# Patient Record
Sex: Female | Born: 1965 | Hispanic: Yes | Marital: Married
Health system: Southern US, Community
[De-identification: ages and names within clinical notes are randomized; demographics above are authoritative.]

## PROBLEM LIST (undated history)

## (undated) DIAGNOSIS — I10 Essential (primary) hypertension: Secondary | ICD-10-CM

## (undated) HISTORY — PX: ABDOMINAL HYSTERECTOMY: SHX81

## (undated) HISTORY — DX: Essential (primary) hypertension: I10

---

## 2006-05-14 ENCOUNTER — Ambulatory Visit: Payer: Self-pay | Admitting: Family Medicine

## 2006-05-30 ENCOUNTER — Ambulatory Visit: Payer: Self-pay | Admitting: Family Medicine

## 2006-07-09 ENCOUNTER — Inpatient Hospital Stay: Payer: Self-pay | Admitting: Obstetrics and Gynecology

## 2014-06-28 ENCOUNTER — Emergency Department: Payer: Self-pay | Admitting: Emergency Medicine

## 2014-06-28 LAB — CBC WITH DIFFERENTIAL/PLATELET
BASOS PCT: 0.5 %
Basophil #: 0 10*3/uL (ref 0.0–0.1)
Eosinophil #: 0.4 10*3/uL (ref 0.0–0.7)
Eosinophil %: 4.4 %
HCT: 30.3 % — ABNORMAL LOW (ref 35.0–47.0)
HGB: 9.2 g/dL — AB (ref 12.0–16.0)
LYMPHS PCT: 14.6 %
Lymphocyte #: 1.3 10*3/uL (ref 1.0–3.6)
MCH: 19.1 pg — ABNORMAL LOW (ref 26.0–34.0)
MCHC: 30.3 g/dL — ABNORMAL LOW (ref 32.0–36.0)
MCV: 63 fL — ABNORMAL LOW (ref 80–100)
MONO ABS: 0.4 x10 3/mm (ref 0.2–0.9)
MONOS PCT: 3.9 %
NEUTROS ABS: 6.8 10*3/uL — AB (ref 1.4–6.5)
NEUTROS PCT: 76.6 %
Platelet: 564 10*3/uL — ABNORMAL HIGH (ref 150–440)
RBC: 4.82 10*6/uL (ref 3.80–5.20)
RDW: 25.2 % — AB (ref 11.5–14.5)
WBC: 8.9 10*3/uL (ref 3.6–11.0)

## 2014-06-28 LAB — URINALYSIS, COMPLETE
Bacteria: NONE SEEN
Bilirubin,UR: NEGATIVE
Glucose,UR: NEGATIVE mg/dL (ref 0–75)
Ketone: NEGATIVE
Nitrite: NEGATIVE
Ph: 5 (ref 4.5–8.0)
Protein: 100
Specific Gravity: 1.026 (ref 1.003–1.030)
Squamous Epithelial: 9
Transitional Epi: 2

## 2014-06-28 LAB — COMPREHENSIVE METABOLIC PANEL
ALBUMIN: 3.3 g/dL — AB (ref 3.4–5.0)
Alkaline Phosphatase: 92 U/L
Anion Gap: 8 (ref 7–16)
BILIRUBIN TOTAL: 0.4 mg/dL (ref 0.2–1.0)
BUN: 8 mg/dL (ref 7–18)
CALCIUM: 9.1 mg/dL (ref 8.5–10.1)
CO2: 28 mmol/L (ref 21–32)
Chloride: 104 mmol/L (ref 98–107)
Creatinine: 0.58 mg/dL — ABNORMAL LOW (ref 0.60–1.30)
EGFR (African American): >60
EGFR (Non-African Amer.): >60
Glucose: 117 mg/dL — ABNORMAL HIGH (ref 65–99)
Osmolality: 279 (ref 275–301)
Potassium: 3.7 mmol/L (ref 3.5–5.1)
SGOT(AST): 15 U/L (ref 15–37)
SGPT (ALT): 13 U/L (ref 12–78)
SODIUM: 140 mmol/L (ref 136–145)
Total Protein: 8.2 g/dL (ref 6.4–8.2)

## 2014-06-28 LAB — PROTIME-INR
INR: 1
Prothrombin Time: 13.5 secs (ref 11.5–14.7)

## 2014-06-29 ENCOUNTER — Ambulatory Visit: Payer: Self-pay | Admitting: Oncology

## 2014-06-29 LAB — CBC CANCER CENTER
BASOS ABS: 0 x10 3/mm (ref 0.0–0.1)
Basophil %: 0.4 %
Eosinophil #: 0.1 x10 3/mm (ref 0.0–0.7)
Eosinophil %: 0.9 %
HCT: 32.7 % — AB (ref 35.0–47.0)
HGB: 9.8 g/dL — AB (ref 12.0–16.0)
LYMPHS ABS: 0.9 x10 3/mm — AB (ref 1.0–3.6)
Lymphocyte %: 10.8 %
MCH: 19 pg — AB (ref 26.0–34.0)
MCHC: 30.1 g/dL — ABNORMAL LOW (ref 32.0–36.0)
MCV: 63 fL — AB (ref 80–100)
MONOS PCT: 2.7 %
Monocyte #: 0.2 x10 3/mm (ref 0.2–0.9)
Neutrophil #: 7.3 x10 3/mm — ABNORMAL HIGH (ref 1.4–6.5)
Neutrophil %: 85.2 %
Platelet: 557 x10 3/mm — ABNORMAL HIGH (ref 150–440)
RBC: 5.18 10*6/uL (ref 3.80–5.20)
RDW: 26.4 % — ABNORMAL HIGH (ref 11.5–14.5)
WBC: 8.6 x10 3/mm (ref 3.6–11.0)

## 2014-06-29 LAB — COMPREHENSIVE METABOLIC PANEL
ANION GAP: 7 (ref 7–16)
Albumin: 3.3 g/dL — ABNORMAL LOW (ref 3.4–5.0)
Alkaline Phosphatase: 100 U/L
BUN: 4 mg/dL — ABNORMAL LOW (ref 7–18)
Bilirubin,Total: 0.4 mg/dL (ref 0.2–1.0)
Calcium, Total: 9 mg/dL (ref 8.5–10.1)
Chloride: 104 mmol/L (ref 98–107)
Co2: 26 mmol/L (ref 21–32)
Creatinine: 0.61 mg/dL (ref 0.60–1.30)
EGFR (African American): >60
EGFR (Non-African Amer.): >60
Glucose: 111 mg/dL — ABNORMAL HIGH (ref 65–99)
Osmolality: 271 (ref 275–301)
POTASSIUM: 3.9 mmol/L (ref 3.5–5.1)
SGOT(AST): 15 U/L (ref 15–37)
SGPT (ALT): 15 U/L (ref 12–78)
SODIUM: 137 mmol/L (ref 136–145)
Total Protein: 8.6 g/dL — ABNORMAL HIGH (ref 6.4–8.2)

## 2014-06-29 LAB — IRON AND TIBC
IRON SATURATION: 6 %
Iron Bind.Cap.(Total): 281 ug/dL (ref 250–450)
Iron: 17 ug/dL — ABNORMAL LOW (ref 50–170)
Unbound Iron-Bind.Cap.: 264 ug/dL

## 2014-06-29 LAB — HCG, QUANTITATIVE, PREGNANCY

## 2014-06-29 LAB — FERRITIN: Ferritin (ARMC): 29 ng/mL (ref 8–388)

## 2014-06-30 LAB — CA 125: CA 125: 48.3 U/mL — ABNORMAL HIGH (ref 0.0–34.0)

## 2014-06-30 LAB — URINE CULTURE

## 2014-07-06 ENCOUNTER — Ambulatory Visit: Payer: Self-pay | Admitting: Gynecologic Oncology

## 2014-07-06 LAB — BASIC METABOLIC PANEL
Anion Gap: 8 (ref 7–16)
BUN: 8 mg/dL (ref 7–18)
CO2: 26 mmol/L (ref 21–32)
Calcium, Total: 9.1 mg/dL (ref 8.5–10.1)
Chloride: 102 mmol/L (ref 98–107)
Creatinine: 0.71 mg/dL (ref 0.60–1.30)
EGFR (African American): >60
EGFR (Non-African Amer.): >60
Glucose: 115 mg/dL — ABNORMAL HIGH (ref 65–99)
Osmolality: 271 (ref 275–301)
Potassium: 3.7 mmol/L (ref 3.5–5.1)
Sodium: 136 mmol/L (ref 136–145)

## 2014-07-06 LAB — CBC
HCT: 31.4 % — ABNORMAL LOW (ref 35.0–47.0)
HGB: 9.5 g/dL — ABNORMAL LOW (ref 12.0–16.0)
MCH: 19.7 pg — ABNORMAL LOW (ref 26.0–34.0)
MCHC: 30.3 g/dL — ABNORMAL LOW (ref 32.0–36.0)
MCV: 65 fL — ABNORMAL LOW (ref 80–100)
Platelet: 545 10*3/uL — ABNORMAL HIGH (ref 150–440)
RBC: 4.83 10*6/uL (ref 3.80–5.20)
RDW: 25.7 % — ABNORMAL HIGH (ref 11.5–14.5)
WBC: 7.6 10*3/uL (ref 3.6–11.0)

## 2014-07-07 LAB — CEA: CEA: 1.4 ng/mL (ref 0.0–4.7)

## 2014-07-12 ENCOUNTER — Inpatient Hospital Stay: Payer: Self-pay | Admitting: Obstetrics & Gynecology

## 2014-07-13 LAB — BASIC METABOLIC PANEL
Anion Gap: 7 (ref 7–16)
BUN: 3 mg/dL — AB (ref 7–18)
CALCIUM: 8.1 mg/dL — AB (ref 8.5–10.1)
CREATININE: 0.68 mg/dL (ref 0.60–1.30)
Chloride: 103 mmol/L (ref 98–107)
Co2: 27 mmol/L (ref 21–32)
EGFR (African American): >60
EGFR (Non-African Amer.): >60
Glucose: 122 mg/dL — ABNORMAL HIGH (ref 65–99)
OSMOLALITY: 272 (ref 275–301)
POTASSIUM: 4.1 mmol/L (ref 3.5–5.1)
SODIUM: 137 mmol/L (ref 136–145)

## 2014-07-13 LAB — HEMATOCRIT: HCT: 26.7 % — ABNORMAL LOW (ref 35.0–47.0)

## 2014-07-15 LAB — PATHOLOGY REPORT

## 2014-07-30 ENCOUNTER — Ambulatory Visit: Payer: Self-pay | Admitting: Oncology

## 2015-04-22 NOTE — Op Note (Signed)
PATIENT NAME:  Nicole BameREDES, Nicole Hensley MR#:  [id number] DATE OF BIRTH:  Sep 25, 1966  DATE OF PROCEDURE:  07/12/2014  PREOPERATIVE DIAGNOSIS: Pelvic abdominal mass.   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   PROCEDURES PERFORMED: 1.  Exploratory laparotomy.  2.  Total abdominal hysterectomy.  3.  Bilateral salpingectomy.  4.  Lysis of adhesions.   SURGEON:  Wendy PoetBrigitte Yuka Lallier, MD  COMPLICATIONS: None.   ANESTHESIA: General.   ESTIMATED BLOOD LOSS: 350 mL.  INDICATION FOR SURGERY: Ms. Nicole Hensley is a 49 year old patient who presented with a large mainly cystic mass arising from the pelvis and extending into the upper abdomen. Therefore, the decision was made to proceed with surgery.   FINDINGS AT TIME OF SURGERY: About a 30 cm mass arising from the uterine fundus. This was mainly cystic with significant areas of necrosis. Fluid had been leaking prior to the laparotomy. Both tubes and ovaries were unremarkable. There were signs of peritoneal irritation and adhesions, but otherwise inspection of the upper abdomen and the bowel was unremarkable.   OPERATIVE REPORT: After adequate general anesthesia had been obtained, the patient was prepped and draped in supine position. A midline incision was placed with a sharp knife and carried down through the fascia. The peritoneum was identified and entered. Adhesions were lysed mainly in blunt fashion then cytology was obtained. A trocar was placed into the tumor, however, due to the partial necrosis it was impossible to contain the fluid pouring out. About 9 liters of fluid were removed. Then lysis of adhesions was continued until the mass could be completely mobilized and the pelvis evaluated. The mass was on a 4 cm stalk to the uterus. Heaney clamps were placed through the stalk and the mass was removed and sent for frozen section analysis, which confirmed the presence of a smooth muscle tumor without increased mitosis or  features worrisome for malignancy.   A Bookwalter retractor was then placed and the bowel was packed away from the pelvis.   Attention was then directed towards the hysterectomy. The round ligament on the right side was stitch ligated and transected. The pelvic sidewall was entered. The fallopian tube was removed from the normal ovary using cautery. Then a clamp was placed around the utero-ovarian ligament which was then cut, tied and stitch ligated using 0 Vicryl. The same procedure was performed on the contralateral side. Then the anterior fold of the peritoneum was incised and the bladder was freed from lower uterine segment, cervix and upper vagina. Dissection was carefully continued until the uterine vessels were identified, clamped cut and stitch ligated using 0 Vicryl. The uterus was then freed from the remainder of the cardinal uterosacral ligament by serially placing clamps and cutting pedicles which were stitch ligated with 0 Vicryl. Finally, the vaginal apex was entered and uterus and both tubes and cervix were removed completely. The vagina was closed with figure-of-eight stitches using 0 Vicryl. Irrigation of the pelvis was performed and adequate hemostasis was confirmed. The ovaries were fixated to the stump of the round ligament.   Exploration of the upper abdomen was done and adequate hemostasis was noted in all areas. Signs of chronic adhesions were seen. A small amount of tumor was still persistent on the sigmoid mesentery. This was removed by serially clamping pedicles, which were cut and stitch ligated with 2-0 Vicryl.   Copious irrigation of the abdomen was done and adequate hemostasis confirmed in all areas. Lap sponges and retractors were removed. A small omental biopsy was  taken by dissecting pedicles, which were clamped, cut and ligated with 0 Vicryl.   The fascia was closed with a running #1 PDS loop suture. Irrigation of the subcutaneous tissue was done and adequate hemostasis  confirmed before it was reapproximated with 2-0 Vicryl, and the skin was closed with a 4-0 Vicryl intracuticular stitch. Dermabond was applied.   The patient tolerated the procedure well and was taken to the recovery room in satisfactory condition. Postoperative urine was clear. Pad, sponge, needle and instrument counts were correct x2.   ____________________________ Maxine Glenn, MD bem:sb D: 07/12/2014 13:50:29 ET T: 07/12/2014 15:41:45 ET JOB#: [id number]  cc: Maxine Glenn, MD, <Dictator> Maxine Glenn MD ELECTRONICALLY SIGNED 07/12/2014 18:06

## 2016-02-11 IMAGING — CT CT ABD-PELV W/ CM
2 of 5 series · 16 of 46 positions shown, 18 images · IV contrast (isovue)
Comparison: None.

CLINICAL DATA: Lower abdominal pain and distention.

EXAM:
CT ABDOMEN AND PELVIS WITH CONTRAST
TECHNIQUE: Multidetector CT imaging of the abdomen and pelvis was performed
using the standard protocol following bolus administration of
intravenous contrast.
CONTRAST:  100 mL Isovue 300

[Series 2: routine abd pel with · axial · 0.86mm/px · z∈[-696,-276]mm · 13 of 96 slices shown, 15 images]
[im 6/96  soft-tissue]
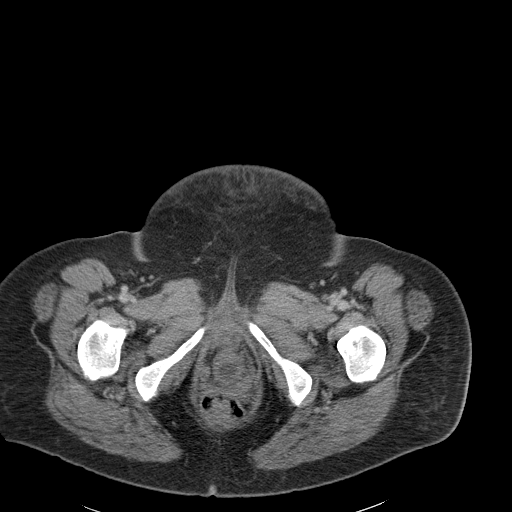
[im 6/96  bone]
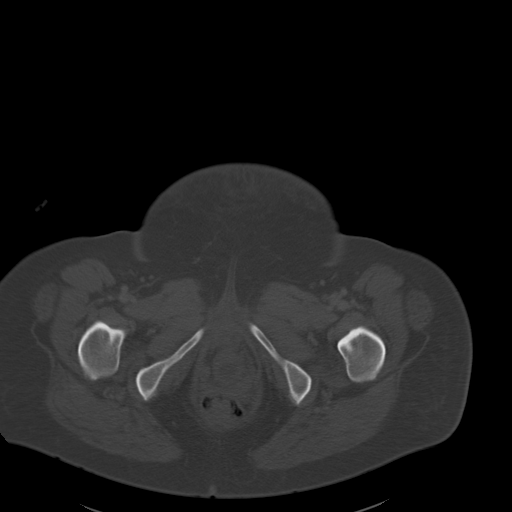
[im 11/96  soft-tissue]
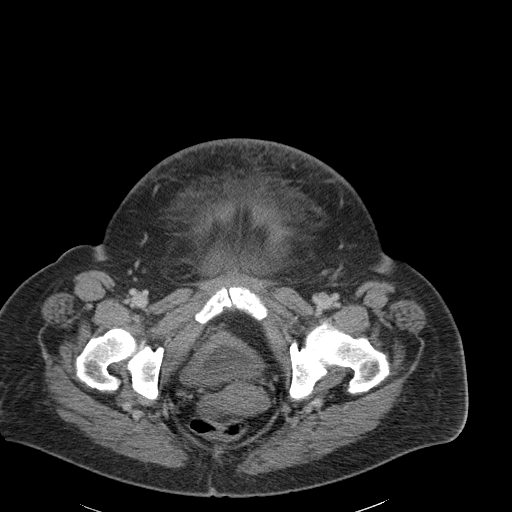
[im 22/96  soft-tissue]
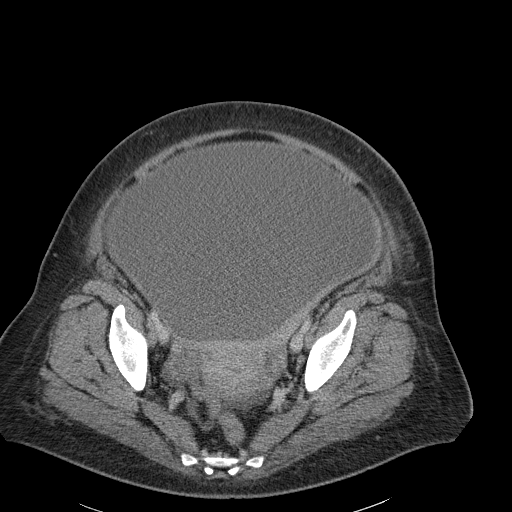
[im 27/96  soft-tissue]
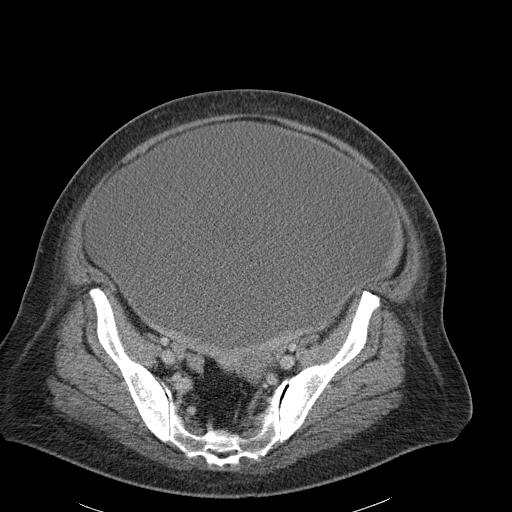
[im 32/96  soft-tissue]
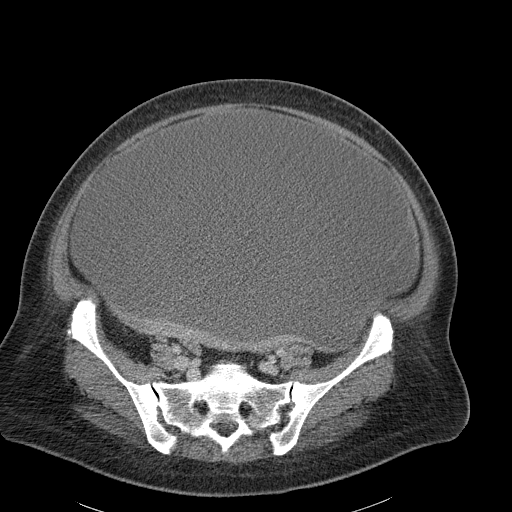
[im 43/96  soft-tissue]
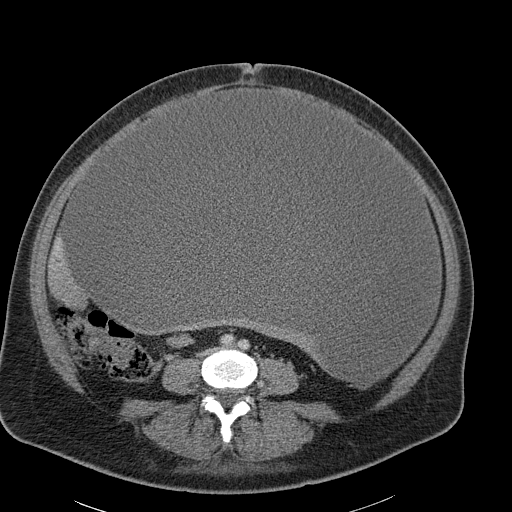
[im 48/96  soft-tissue]
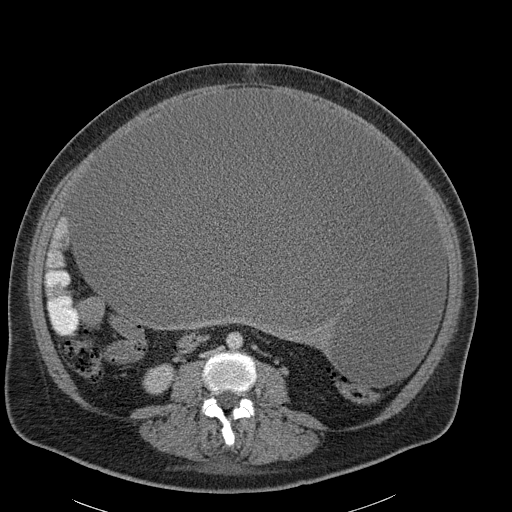
[im 53/96  soft-tissue]
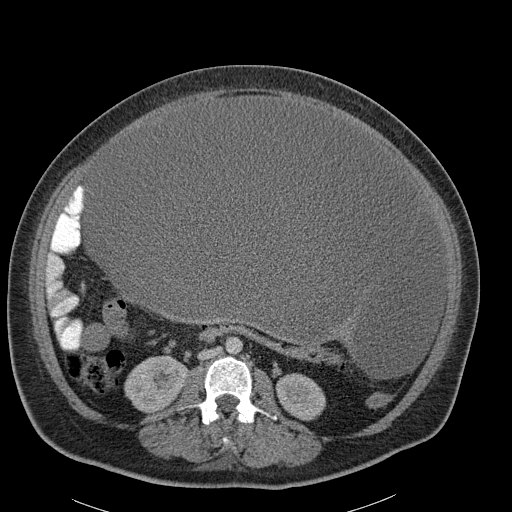
[im 64/96  soft-tissue]
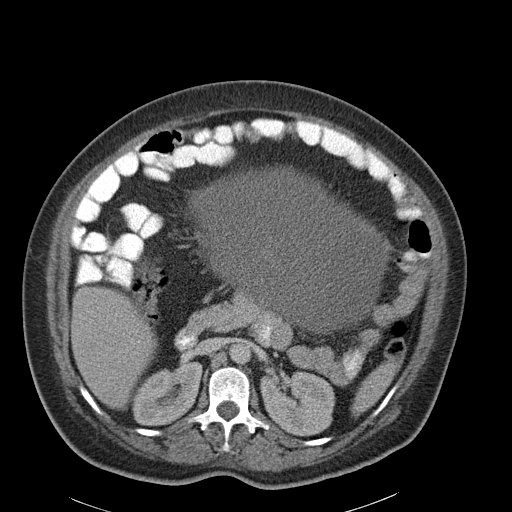
[im 64/96  bone]
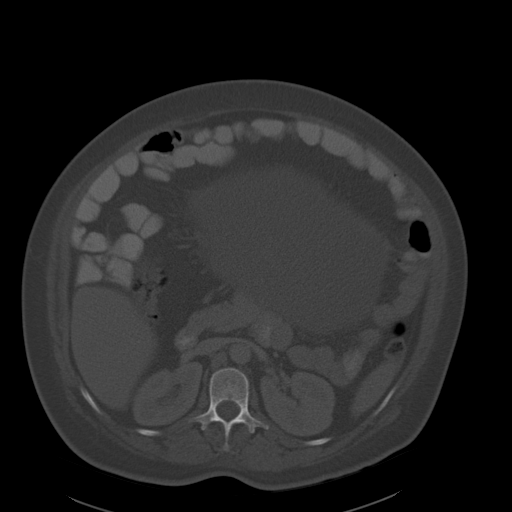
[im 69/96  soft-tissue]
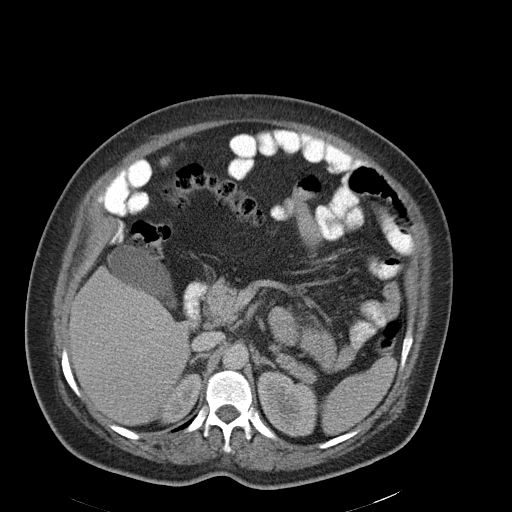
[im 74/96  soft-tissue]
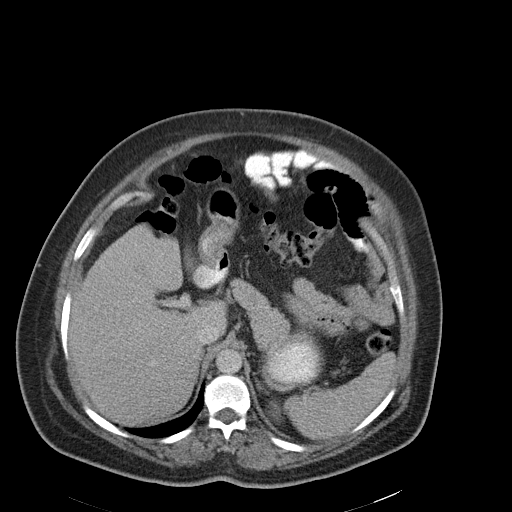
[im 85/96  soft-tissue]
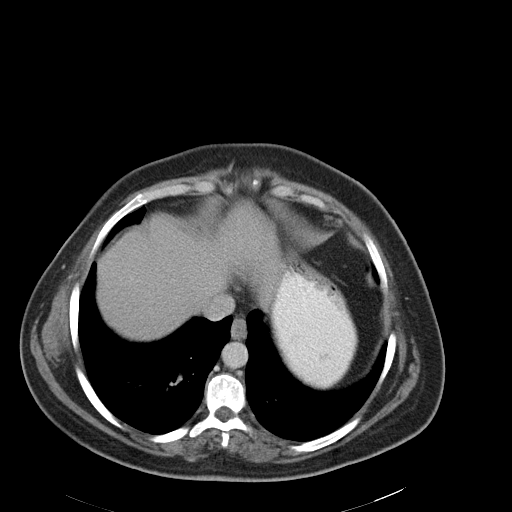
[im 90/96  soft-tissue]
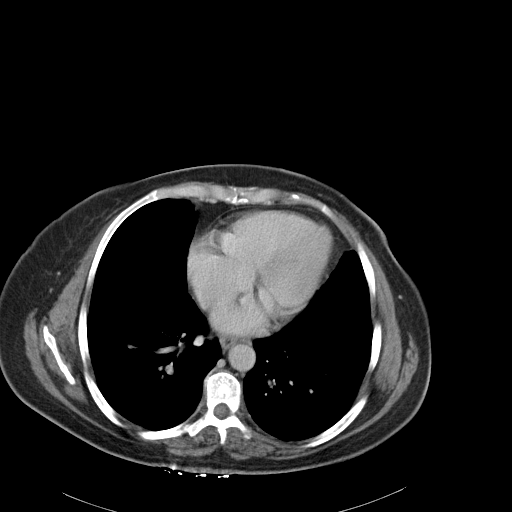

[Series 5: cor routine abd pel with · coronal · 0.64mm/px · 3 of 177 slices shown]
[im 59/177  soft-tissue]
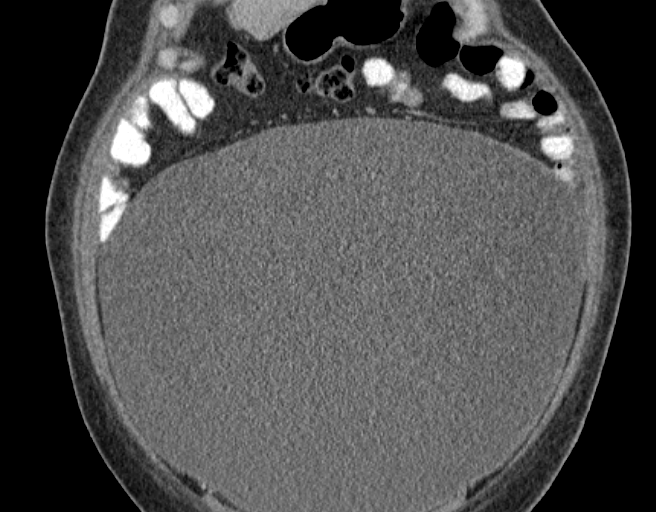
[im 79/177  soft-tissue]
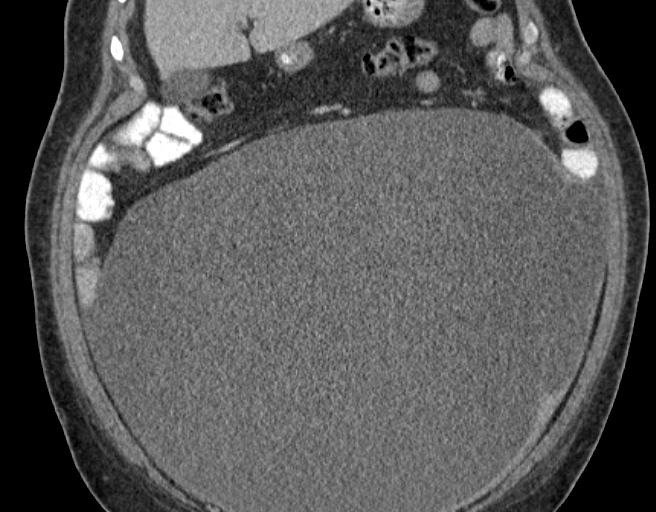
[im 98/177  soft-tissue]
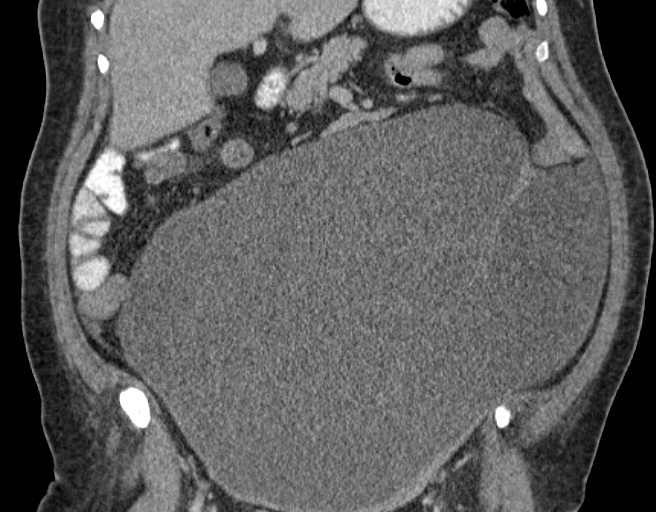

[16 of 46 positions shown; findings below may reference images not displayed]

FINDINGS: A large complex cystic mass is seen in the lower abdomen and pelvis
which shows at least 1 internal septation and a thick wall
posteriorly. This measures 21 x 33 cm and is suspicious for a large
cystic ovarian neoplasm. A small amount of ascites is seen within
the pelvis.

No other soft tissue masses or lymphadenopathy identified within the
abdomen or pelvis. No evidence hydronephrosis. The kidneys, spleen,
pancreas, adrenal glands, liver, and gallbladder are normal in
appearance. No evidence of inflammatory process or abscess. No
evidence of bowel obstruction.
IMPRESSION: 21 x 33 cm complex cystic mass in the lower abdomen and pelvis,
suspicious for large cystic ovarian neoplasm. Recommend GYN
consultation for surgical evaluation.

Small amount of pelvic ascites.

## 2016-08-13 ENCOUNTER — Emergency Department
Admission: EM | Admit: 2016-08-13 | Discharge: 2016-08-13 | Disposition: A | Discharge status: Home / Self Care | Payer: Self-pay | Attending: Emergency Medicine | Admitting: Emergency Medicine

## 2016-08-13 ENCOUNTER — Encounter: Payer: Self-pay | Admitting: Emergency Medicine

## 2016-08-13 ENCOUNTER — Emergency Department: Payer: Self-pay

## 2016-08-13 DIAGNOSIS — M20012 Mallet finger of left finger(s): Secondary | ICD-10-CM | POA: Insufficient documentation

## 2016-08-13 DIAGNOSIS — Y929 Unspecified place or not applicable: Secondary | ICD-10-CM | POA: Insufficient documentation

## 2016-08-13 DIAGNOSIS — Y999 Unspecified external cause status: Secondary | ICD-10-CM | POA: Insufficient documentation

## 2016-08-13 DIAGNOSIS — Y9389 Activity, other specified: Secondary | ICD-10-CM | POA: Insufficient documentation

## 2016-08-13 DIAGNOSIS — W228XXA Striking against or struck by other objects, initial encounter: Secondary | ICD-10-CM | POA: Insufficient documentation

## 2016-08-13 MED ORDER — NAPROXEN 500 MG PO TABS: 500.0000 mg | ORAL_TABLET | Freq: Two times a day (BID) | ORAL | 0 refills | Status: AC

## 2016-08-13 NOTE — ED Provider Notes (Signed)
[facility] Emergency Department Provider Note  ____________________________________________  Time seen: Approximately 10:50 PM  I have reviewed the triage vital signs and the nursing notes.   HISTORY  Chief Complaint Finger Injury    HPI Nicole Hensley is a 50 y.o. female , NAD, presents to the emergency department accompanied by her daughter who assists with translation along with the medical interpreter. Patient states she was moving a mattress and hit her left ring finger upon it. States she had pain and swelling about the tip of the finger and notes that it is bent. States she is not able to straighten the finger since the incident. Denies any open wounds or lacerations. Has not had any numbness, weakness, tingling of the finger.   History reviewed. No pertinent past medical history.  There are no active problems to display for this patient.   Past Surgical History:  Procedure Laterality Date  . ABDOMINAL HYSTERECTOMY      Prior to Admission medications   Medication Sig Start Date End Date Taking? Authorizing Provider  naproxen (NAPROSYN) 500 MG tablet Take 1 tablet (500 mg total) by mouth 2 (two) times daily with a meal. 08/13/16   Andry Bogden L Samella Lucchetti, PA-C    Allergies Review of patient's allergies indicates no known allergies.  No family history on file.  Social History Social History  Substance Use Topics  . Smoking status: Not on file  . Smokeless tobacco: Not on file  . Alcohol use Not on file     Review of Systems  Constitutional: No fatigue Musculoskeletal: Positive left ring finger pain. Negative for left wrist, hand pain.  Skin: Positive swelling left ring finger. Negative for rash, redness, lacerations, open wounds. Neurological: Negative for headaches, focal weakness or numbness. No tingling 10-point ROS otherwise negative.  ____________________________________________   PHYSICAL EXAM:  VITAL SIGNS: ED Triage Vitals  Enc  Vitals Group     BP 08/13/16 2223 (!) 157/82     Pulse Rate 08/13/16 2223 72     Resp 08/13/16 2223 18     Temp 08/13/16 2223 97.6 F (36.4 C)     Temp Source 08/13/16 2223 Oral     SpO2 08/13/16 2223 98 %     Weight 08/13/16 2221 178 lb (80.7 kg)     Height 08/13/16 2221 5\' 1"  (1.549 m)     Head Circumference --      Peak Flow --      Pain Score 08/13/16 2221 6     Pain Loc --      Pain Edu? --      Excl. in GC? --      Constitutional: Alert and oriented. Well appearing and in no acute distress. Eyes: Conjunctivae are normal without icterus or injection Head: Atraumatic. Cardiovascular:  Good peripheral circulation with 2+ pulses noted in the left upper extremity. Capillary refill is brisk in all digits of left hand. Respiratory: Normal respiratory effort without tachypnea or retractions. Musculoskeletal: Mild pain to palpation over the distal left ring finger. Patient unable to extend the distal phalanx on the left ring finger but can flex the finger and full range of motion. No tenderness to palpation about the left hand wrist. No masses noted about palpation of the left ring finger, palm nor wrist. No laxity with manipulation of the left ring PIP or DIP. Neurologic:  Normal speech and language. No gross focal neurologic deficits are appreciated.  Skin:  Mild swelling noted about the left ring finger DIP.  Skin is warm, dry and intact. No rash, redness, skin sores, open wounds noted. Psychiatric: Mood and affect are normal. Speech and behavior are normal. Patient exhibits appropriate insight and judgement.   ____________________________________________   LABS  None ____________________________________________  EKG  None ____________________________________________  RADIOLOGY I have personally viewed and evaluated these images (plain radiographs) as part of my medical decision making, as well as reviewing the written report by the radiologist.  No laceration was present  over the left ring finger.  Dg Finger Ring Left  Result Date: 08/13/2016 CLINICAL DATA:  Injury to left finger while lifting mattress. Unable to extend digit. EXAM: LEFT RING FINGER 2+V COMPARISON:  None. FINDINGS: A soft tissue laceration is evident at the PIP joint. There is incomplete extension of the DIP joint. No acute osseous abnormality is present. This may represent injury to the extensor tendon sheath. IMPRESSION: 1. Soft tissues laceration over the dorsal aspect of the PIP joint. 2. Incomplete extension of the DIP joint. 3. No acute osseous abnormality. 4. Possible soft tissue injury to the extensor mechanism. Electronically Signed   By: Marin Robertshristopher  Mattern M.D.   On: 08/13/2016 23:04    ____________________________________________    PROCEDURES  Procedure(s) performed: None   .Splint Application Date/Time: 08/13/2016 11:07 PM Performed by: Tye SavoyHAGLER, Marinna Blane L Authorized by: Hope PigeonHAGLER, Lakima Dona L   Consent:    Consent obtained:  Verbal   Consent given by:  Patient   Risks discussed:  Discoloration, numbness, pain and swelling   Alternatives discussed:  No treatment Pre-procedure details:    Sensation:  Normal   Skin color:  Normal Procedure details:    Laterality:  Left   Location:  Finger   Finger:  L ring finger   Strapping: no     Splint type:  Finger   Supplies:  Prefabricated splint Post-procedure details:    Pain:  Improved   Sensation:  Normal   Skin color:  Normal   Patient tolerance of procedure:  Tolerated well, no immediate complications     Medications - No data to display   ____________________________________________   INITIAL IMPRESSION / ASSESSMENT AND PLAN / ED COURSE  Pertinent labs & imaging results that were available during my care of the patient were reviewed by me and considered in my medical decision making (see chart for details).  Clinical Course    Patient's diagnosis is consistent with mallet finger of left ring finger. Patient was  placed in a finger splint in hyperextension. May buy stack finger splint OTC but must maintain hyperextension. Patient will be discharged home with prescriptions for naproxen to take as needed. Patient is to follow up with Dr. Martha ClanKrasinski in orthopedics if finger is not straight in 6-8 weeks or sooner if pain persists. Patient is given ED precautions to return to the ED for any worsening or new symptoms.    ____________________________________________  FINAL CLINICAL IMPRESSION(S) / ED DIAGNOSES  Final diagnoses:  Mallet finger, left      NEW MEDICATIONS STARTED DURING THIS VISIT:  New Prescriptions   NAPROXEN (NAPROSYN) 500 MG TABLET    Take 1 tablet (500 mg total) by mouth 2 (two) times daily with a meal.         Hope PigeonJami L Kayon Dozier, PA-C 08/13/16 2316    Nita Sicklearolina Veronese, MD 08/14/16 2256

## 2016-08-13 NOTE — ED Triage Notes (Signed)
Patient ambulatory to triage with steady gait, without difficulty or distress noted; [facility], pt reports injuring left 4th finger while lifting mattress 3hrs PTA

## 2017-07-09 ENCOUNTER — Ambulatory Visit
Admission: RE | Admit: 2017-07-09 | Discharge: 2017-07-09 | Disposition: A | Discharge status: Home / Self Care | Payer: Self-pay | Source: Ambulatory Visit | Attending: Oncology | Admitting: Oncology

## 2017-07-09 ENCOUNTER — Ambulatory Visit: Payer: Self-pay | Attending: Oncology | Admitting: *Deleted

## 2017-07-09 ENCOUNTER — Encounter: Payer: Self-pay | Admitting: *Deleted

## 2017-07-09 ENCOUNTER — Encounter (INDEPENDENT_AMBULATORY_CARE_PROVIDER_SITE_OTHER): Payer: Self-pay

## 2017-07-09 VITALS — BP 125/84 | HR 64 | Temp 98.5°F | Resp 18 | Ht 61.0 in | Wt 191.0 lb

## 2017-07-09 DIAGNOSIS — Z0000 Encounter for general adult medical examination without abnormal findings: Secondary | ICD-10-CM

## 2017-07-09 NOTE — Patient Instructions (Signed)
Gave patient hand-out, Women Staying Healthy, Active and Well from [facility], with education on breast health, pap smears, heart and colon health. 

## 2017-07-09 NOTE — Progress Notes (Signed)
Subjective:     Patient ID: Christen Bameinfa Dye, female   DOB: 1966/02/18, 51 y.o.   MRN: [id number]  HPI   Review of Systems     Objective:   Physical Exam  Pulmonary/Chest: Right breast exhibits no inverted nipple, no mass, no nipple discharge, no skin change and no tenderness. Left breast exhibits no inverted nipple, no mass, no nipple discharge, no skin change and no tenderness. Breasts are symmetrical.       Assessment:     51 year old Hispanic [facility] and [facility].  Myriam JacobsonHelen the interpreter present during the interview and exam.  Clinical breast exam unremarkable.  Taught self breast awareness.  Patient has a history of hysterectomy for elevated Ca-125.  Was seen by Dr. Doylene Canninghoksi and Dr. Purvis SheffieldBridgett Miller.  Patient states no cancer detected.  Patient has been screened for eligibility.  She does not have any insurance, Medicare or Medicaid.  She also meets financial eligibility.  Hand-out given on the Affordable Care Act.    Plan:     Baseline screening mammogram ordered.

## 2017-07-11 ENCOUNTER — Encounter: Payer: Self-pay | Admitting: *Deleted

## 2017-07-11 NOTE — Progress Notes (Signed)
Letter mailed to inform patient of her normal mammogram.  She is to return in one year with annual screening.  HSIS to Christy. 

## 2018-03-29 IMAGING — CR DG FINGER RING 2+V*L*
1 series · 3 of 3 positions shown · non-contrast
Comparison: None.

CLINICAL DATA: Injury to left finger while lifting mattress. Unable
to extend digit.

EXAM:
LEFT RING FINGER 2+V

[Series 1: dg finger ring left · 0.14mm/px · 3 of 3 slices shown]
[im 1/3]
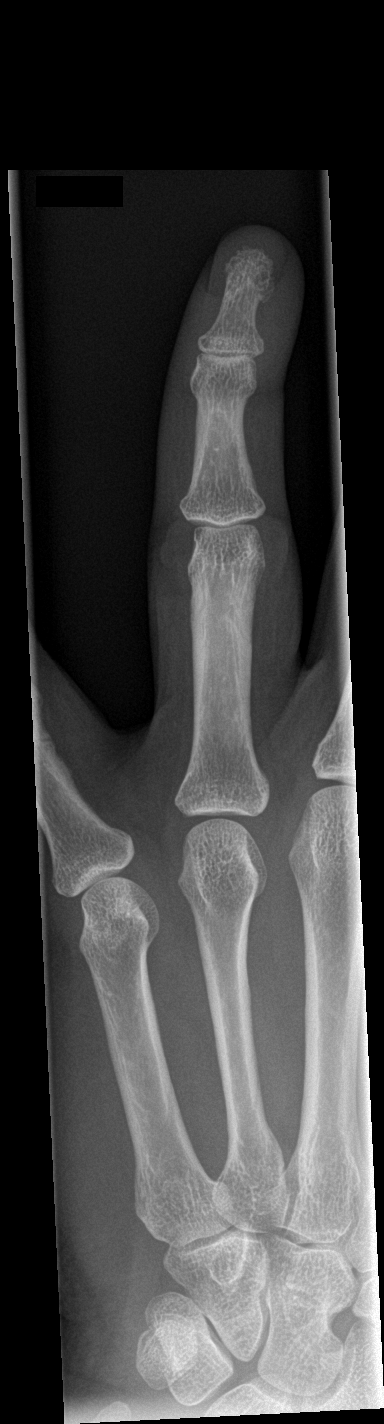
[im 2/3]
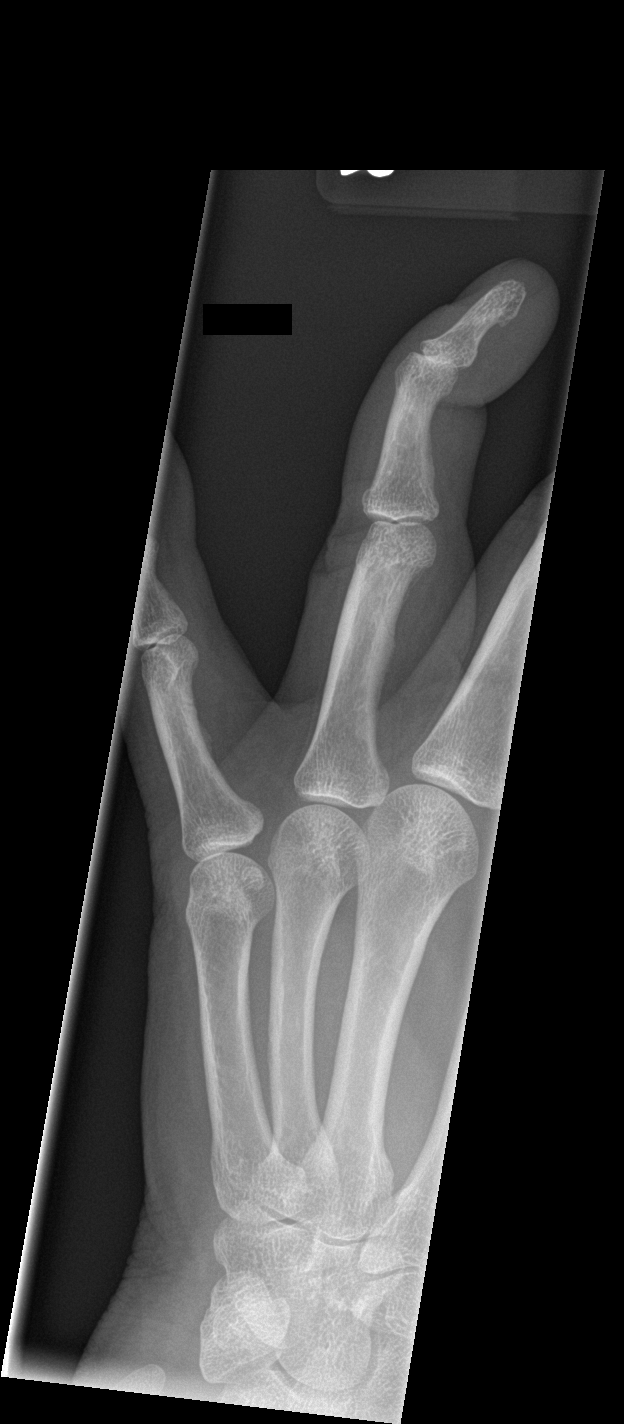
[im 3/3]
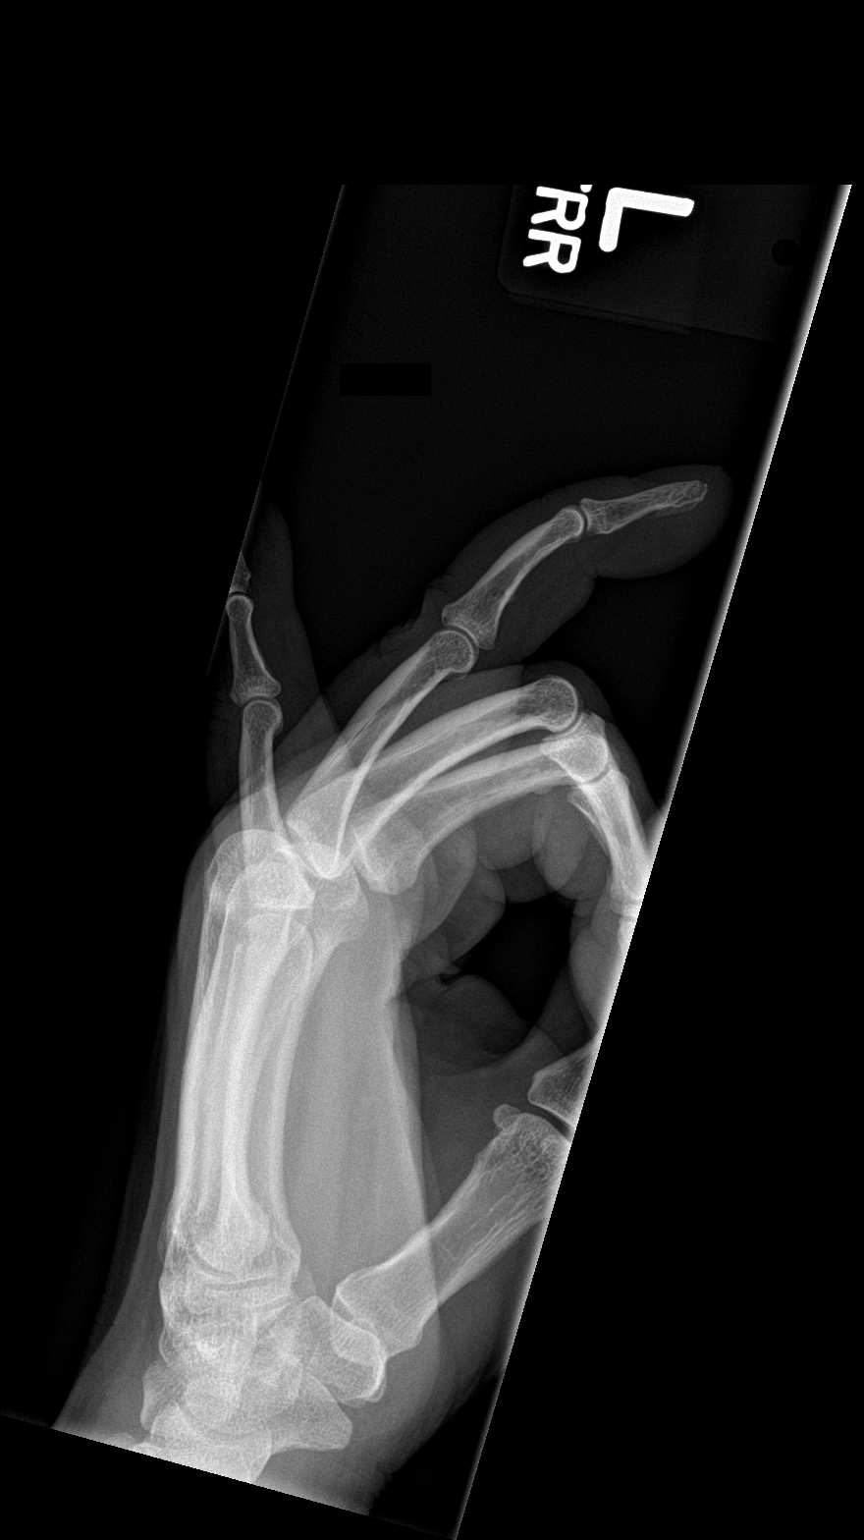

[3 of 3 positions shown; findings below may reference images not displayed]

FINDINGS: A soft tissue laceration is evident at the PIP joint. There is
incomplete extension of the DIP joint. No acute osseous abnormality
is present. This may represent injury to the extensor tendon sheath.
IMPRESSION: 1. Soft tissues laceration over the dorsal aspect of the PIP joint.
2. Incomplete extension of the DIP joint.
3. No acute osseous abnormality.
4. Possible soft tissue injury to the extensor mechanism.

## 2019-03-16 ENCOUNTER — Encounter: Payer: Self-pay | Admitting: *Deleted

## 2019-03-22 ENCOUNTER — Ambulatory Visit: Payer: Self-pay

## 2022-09-01 ENCOUNTER — Other Ambulatory Visit: Payer: Self-pay

## 2022-09-01 ENCOUNTER — Emergency Department
Admission: EM | Admit: 2022-09-01 | Discharge: 2022-09-01 | Disposition: A | Discharge status: Home / Self Care | Payer: Self-pay | Attending: Emergency Medicine | Admitting: Emergency Medicine

## 2022-09-01 ENCOUNTER — Encounter: Payer: Self-pay | Admitting: Emergency Medicine

## 2022-09-01 DIAGNOSIS — N3001 Acute cystitis with hematuria: Secondary | ICD-10-CM | POA: Insufficient documentation

## 2022-09-01 LAB — URINALYSIS, COMPLETE (UACMP) WITH MICROSCOPIC
Bilirubin Urine: NEGATIVE
Glucose, UA: NEGATIVE mg/dL
Ketones, ur: NEGATIVE mg/dL
Nitrite: NEGATIVE
Protein, ur: 100 mg/dL — AB
RBC / HPF: >50 RBC/hpf — ABNORMAL HIGH (ref 0–5)
Specific Gravity, Urine: 1.011 (ref 1.005–1.030)
WBC, UA: >50 WBC/hpf — ABNORMAL HIGH (ref 0–5)
pH: 7 (ref 5.0–8.0)

## 2022-09-01 MED ORDER — SULFAMETHOXAZOLE-TRIMETHOPRIM 800-160 MG PO TABS: 1.0000 | ORAL_TABLET | Freq: Two times a day (BID) | ORAL | 0 refills | Status: AC

## 2022-09-01 MED ORDER — SULFAMETHOXAZOLE-TRIMETHOPRIM 800-160 MG PO TABS
1.0000 | ORAL_TABLET | Freq: Once | ORAL | Status: AC
  Administered 2022-09-01: 1 via ORAL
  Filled 2022-09-01: qty 1

## 2022-09-01 NOTE — ED Provider Notes (Signed)
[facility] Provider Note    Event Date/Time   First MD Initiated Contact with Patient 09/01/22 1307     (approximate)   History   Urinary Frequency   HPI  Nicole Hensley is a 56 y.o. female   Is a very pleasant 56 year old female who presents with her son for few days of dysuria, frequency, hematuria.  No fevers or chills.  No flank pain.  No other complaints. Has had hysterectomy.  History was obtained via the patient via Spanish interpreter, and collateral information from son who is at bedside.  Also reviewed external medical notes.      Physical Exam   Triage Vital Signs: ED Triage Vitals  Enc Vitals Group     BP 09/01/22 1249 (!) 166/96     Pulse Rate 09/01/22 1249 95     Resp 09/01/22 1249 20     Temp 09/01/22 1249 98.6 F (37 C)     Temp Source 09/01/22 1249 Oral     SpO2 09/01/22 1249 97 %     Weight 09/01/22 1251 198 lb (89.8 kg)     Height 09/01/22 1251 5\' 1"  (1.549 m)     Head Circumference --      Peak Flow --      Pain Score --      Pain Loc --      Pain Edu? --      Excl. in GC? --     Most recent vital signs: Vitals:   09/01/22 1249  BP: (!) 166/96  Pulse: 95  Resp: 20  Temp: 98.6 F (37 C)  SpO2: 97%    General: Awake, no distress.  CV:  Good peripheral perfusion.  Resp:  Normal effort.  Abd:  No distention.  Nontender to deep palpation all quadrants, no CVA tenderness.   ED Results / Procedures / Treatments   Labs (all labs ordered are listed, but only abnormal results are displayed) Labs Reviewed  URINALYSIS, COMPLETE (UACMP) WITH MICROSCOPIC - Abnormal; Notable for the following components:      Result Value   Color, Urine YELLOW (*)    APPearance CLOUDY (*)    Hgb urine dipstick LARGE (*)    Protein, ur 100 (*)    Leukocytes,Ua LARGE (*)    RBC / HPF >50 (*)    WBC, UA >50 (*)    Bacteria, UA FEW (*)    All other components within normal limits  URINE CULTURE     I reviewed labs and they  are notable for a urinalysis with hemoglobin, leukocytes, and bacteria.  PROCEDURES:  Critical Care performed: No  Procedures   MEDICATIONS ORDERED IN ED: Medications  sulfamethoxazole-trimethoprim (BACTRIM DS) 800-160 MG per tablet 1 tablet (1 tablet Oral Given 09/01/22 1432)     IMPRESSION / MDM / ASSESSMENT AND PLAN / ED COURSE  I reviewed the triage vital signs and the nursing notes.                              Differential diagnosis includes, but is not limited to, urinary tract infection, pyelonephritis, considered but less likely other acute emergent intra-abdominal pathology like appendicitis, obstruction, ovarian torsion, PID.  MDM: Dems consistent with urinary tract infection and a urinalysis which is suggestive strongly of urinary tract infection.  Patient started on antibiotics, discharged home with strict return precautions and PMD follow-up.  Dispo: After careful consideration of this patient's  presentation, medical and social risk factors, and evaluation in the emergency department I engaged in shared decision making with the patient and/or their representative to consider admission or observation and this patient was ultimately discharged because is in stable condition, nontoxic, no suggestions of sepsis on my evaluation today and has a plan for antibiotics outpatient and PMD follow-up. I also considered she will risk factors, but the patient has good family support and her son who accompanies her to the appointment today and PMD follow-up.  Patient's presentation is most consistent with acute, uncomplicated illness.       FINAL CLINICAL IMPRESSION(S) / ED DIAGNOSES   Final diagnoses:  Acute cystitis with hematuria     Rx / DC Orders   ED Discharge Orders          Ordered    sulfamethoxazole-trimethoprim (BACTRIM DS) 800-160 MG tablet  2 times daily        09/01/22 1415             Note:  This document was prepared using Dragon voice recognition  software and may include unintentional dictation errors.    Pilar Jarvis, MD 09/01/22 [phone]

## 2022-09-01 NOTE — Discharge Instructions (Signed)
  Gracias por elegirnos para el cuidado de su salud hoy!  Consulte a su mdico de atencin primaria esta semana para una cita de seguimiento.  Si no tiene un mdico de atencin primaria llame a las siguientes clnicas para establecer la atencin:  Si tienes seguro: [facility] [phone] [address] [place] [address]   [facility] [place] [phone] [address] [place] [address]   Si no tienes seguro: [facility] [phone] [address] [place] [address]  A veces, en las primeras etapas del curso de ciertas enfermedades es difcil detectarlo en la evaluacin del departamento de emergencias; por lo tanto, es importante que contine monitoreando sus sntomas y llame a su mdico de inmediato o regrese al departamento de emergencias si desarrolla alguno. sntomas nuevos o que empeoran.  Fue un placer cuidar de usted hoy.  Raesha Coonrod S. Genelda Roark, MD  

## 2022-09-01 NOTE — ED Triage Notes (Signed)
Pt has had urinary frequency over last week.  Urine had a pink tint and a couple small clots per pt this AM.  Denies any pain but pressure with urination at end.  No fevers.  Some mild pain across lower back.  Hx hysterectomy.  Did have UTI last year.  No abdominal pain.

## 2022-09-03 LAB — URINE CULTURE: Culture: >=100000 — AB

## 2024-06-15 DIAGNOSIS — Z0131 Encounter for examination of blood pressure with abnormal findings: Secondary | ICD-10-CM | POA: Diagnosis not present

## 2024-06-15 DIAGNOSIS — Z1389 Encounter for screening for other disorder: Secondary | ICD-10-CM | POA: Diagnosis not present

## 2024-06-15 DIAGNOSIS — I10 Essential (primary) hypertension: Secondary | ICD-10-CM | POA: Diagnosis not present

## 2024-06-15 DIAGNOSIS — Z1331 Encounter for screening for depression: Secondary | ICD-10-CM | POA: Diagnosis not present

## 2024-06-15 DIAGNOSIS — E559 Vitamin D deficiency, unspecified: Secondary | ICD-10-CM | POA: Diagnosis not present

## 2024-06-16 DIAGNOSIS — R739 Hyperglycemia, unspecified: Secondary | ICD-10-CM | POA: Diagnosis not present

## 2024-06-18 ENCOUNTER — Other Ambulatory Visit: Payer: Self-pay | Admitting: Family Medicine

## 2024-06-18 DIAGNOSIS — Z1231 Encounter for screening mammogram for malignant neoplasm of breast: Secondary | ICD-10-CM

## 2024-08-24 DIAGNOSIS — L400 Psoriasis vulgaris: Secondary | ICD-10-CM | POA: Diagnosis not present

## 2024-08-24 DIAGNOSIS — B353 Tinea pedis: Secondary | ICD-10-CM | POA: Diagnosis not present
